# Patient Record
Sex: Male | Born: 1995 | Race: White | Hispanic: No | Marital: Single | Smoking: Never smoker
Health system: Southern US, Community
[De-identification: ages and names within clinical notes are randomized; demographics above are authoritative.]

## PROBLEM LIST (undated history)

## (undated) DIAGNOSIS — S42309A Unspecified fracture of shaft of humerus, unspecified arm, initial encounter for closed fracture: Secondary | ICD-10-CM

## (undated) HISTORY — DX: Unspecified fracture of shaft of humerus, unspecified arm, initial encounter for closed fracture: S42.309A

## (undated) HISTORY — PX: TONSILLECTOMY: SUR1361

---
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
## 2018-08-09 DIAGNOSIS — R112 Nausea with vomiting, unspecified: Secondary | ICD-10-CM | POA: Diagnosis not present

## 2019-04-22 ENCOUNTER — Ambulatory Visit (HOSPITAL_COMMUNITY)
Admission: EM | Admit: 2019-04-22 | Discharge: 2019-04-22 | Disposition: A | Discharge status: Home / Self Care | Payer: Self-pay | Attending: Family Medicine | Admitting: Family Medicine

## 2019-04-22 ENCOUNTER — Ambulatory Visit (INDEPENDENT_AMBULATORY_CARE_PROVIDER_SITE_OTHER): Payer: Self-pay

## 2019-04-22 ENCOUNTER — Other Ambulatory Visit: Payer: Self-pay

## 2019-04-22 ENCOUNTER — Encounter (HOSPITAL_COMMUNITY): Payer: Self-pay

## 2019-04-22 ENCOUNTER — Ambulatory Visit (HOSPITAL_COMMUNITY): Payer: Self-pay

## 2019-04-22 DIAGNOSIS — M79642 Pain in left hand: Secondary | ICD-10-CM

## 2019-04-22 DIAGNOSIS — Z23 Encounter for immunization: Secondary | ICD-10-CM

## 2019-04-22 DIAGNOSIS — S61211A Laceration without foreign body of left index finger without damage to nail, initial encounter: Secondary | ICD-10-CM

## 2019-04-22 MED ORDER — TETANUS-DIPHTH-ACELL PERTUSSIS 5-2.5-18.5 LF-MCG/0.5 IM SUSP
0.5000 mL | Freq: Once | INTRAMUSCULAR | Status: AC
  Administered 2019-04-22: 0.5 mL via INTRAMUSCULAR

## 2019-04-22 MED ORDER — LIDOCAINE HCL 2 % IJ SOLN
INTRAMUSCULAR | Status: AC
  Filled 2019-04-22: qty 20

## 2019-04-22 MED ORDER — CEPHALEXIN 500 MG PO CAPS: 500.0000 mg | ORAL_CAPSULE | Freq: Two times a day (BID) | ORAL | 0 refills | Status: DC

## 2019-04-22 MED ORDER — LIDOCAINE-EPINEPHRINE (PF) 2 %-1:200000 IJ SOLN
INTRAMUSCULAR | Status: AC
  Filled 2019-04-22: qty 20

## 2019-04-22 MED ORDER — TETANUS-DIPHTH-ACELL PERTUSSIS 5-2.5-18.5 LF-MCG/0.5 IM SUSP
INTRAMUSCULAR | Status: AC
  Filled 2019-04-22: qty 0.5

## 2019-04-22 NOTE — ED Provider Notes (Signed)
[facility]    CSN: [id number] Arrival date & time: 04/22/19  1652      History   Chief Complaint Chief Complaint  Patient presents with  . Hand Injury    HPI Samuel Walker is a 24 y.o. male.   He is presenting with pain in his left hand.  He was at work and smashed his hand against the dumpster.  He was throwing pallets into the dumpster.  He has a laceration and pain over the PIP joint of the second and third digit.  This happened earlier today.  Denies any numbness or tingling.  HPI  Past Medical History:  Diagnosis Date  . Broken arm    right    There are no problems to display for this patient.   Past Surgical History:  Procedure Laterality Date  . APPENDECTOMY  2010  . TONSILLECTOMY         Home Medications    Prior to Admission medications   Medication Sig Start Date End Date Taking? Authorizing Provider  cephALEXin (KEFLEX) 500 MG capsule Take 1 capsule (500 mg total) by mouth 2 (two) times daily. 04/22/19   Myra Rude, MD    Family History No family history on file.  Social History Social History   Tobacco Use  . Smoking status: Never Smoker  Substance Use Topics  . Alcohol use: No  . Drug use: No     Allergies   Patient has no known allergies.   Review of Systems Review of Systems See HPI  Physical Exam Triage Vital Signs ED Triage Vitals [04/22/19 1715]  Enc Vitals Group     BP 136/84     Pulse Rate 71     Resp 16     Temp 98.5 F (36.9 C)     Temp Source Oral     SpO2 97 %     Weight      Height      Head Circumference      Peak Flow      Pain Score 4     Pain Loc      Pain Edu?      Excl. in GC?    No data found.  Updated Vital Signs BP 136/84   Pulse 71   Temp 98.5 F (36.9 C) (Oral)   Resp 16   SpO2 97%   Visual Acuity Right Eye Distance:   Left Eye Distance:   Bilateral Distance:    Right Eye Near:   Left Eye Near:    Bilateral Near:     Physical Exam Gen: NAD, alert, cooperative  with exam, well-appearing Neuro: normal tone, normal sensation to touch Psych:  normal insight, alert and oriented MSK:  Left hand: Flap tear over the PIP joint that is roughly 1 cm in length. Abrasion occurring over the PIP joint of the third digit on the dorsal aspect. Normal flexion. Limited extension. Neurovascular intact   UC Treatments / Results  Labs (all labs ordered are listed, but only abnormal results are displayed) Labs Reviewed - No data to display  EKG   Radiology DG Hand Complete Left  Result Date: 04/22/2019 CLINICAL DATA:  Left hand pain after getting it caught between two pallets today. EXAM: LEFT HAND - COMPLETE 3+ VIEW COMPARISON:  None. FINDINGS: No acute fracture or dislocation. Joint spaces are preserved. Bone mineralization is normal. Soft tissue swelling of the index and long fingers with soft tissue injury dorsal to the second and  third PIP joints and third DIP joint. No radiopaque foreign body identified. IMPRESSION: 1. Soft tissue injury to the index and long fingers. No acute osseous abnormality. Electronically Signed   By: Titus Dubin M.D.   On: 04/22/2019 18:30    Procedures Procedures (including critical care time)  Laceration Procedure Note:   Procedure Name: Laceration Repair Indication: Reduce risk of infection Location: left dorsal index finger PIP joint  Pre-Procedure Diagnosis: Laceration Post-Procedure Diagnosis: Repaired Laceration Informed consent was obtained before procedure started. PROCEDURE: The appropriate timeout was taken. The area was prepped and draped in the usual sterile fashion. Local anesthesia was achieved using 8cc of  Lidocaine 2% without epinephrine and 2 cc of 2% lidocaine with epinephrine. This was performed on a digital block of the index finger.. The wound was copiously irrigated. 5 5-0 Nylon and 4 4-0 interrupted sutures were placed.  Estimated blood loss was less than 0.5 mL. A dressing was applied to the  area and anticipatory guidance, as well as standard post-procedure care, was explained. Return precautions are given. The patient tolerated the procedure well without complications. Follow-up visit set for suture removal and evaluation of the laceration.   Medications Ordered in UC Medications  Tdap (BOOSTRIX) injection 0.5 mL (0.5 mLs Intramuscular Given 04/22/19 1854)    Initial Impression / Assessment and Plan / UC Course  I have reviewed the triage vital signs and the nursing notes.  Pertinent labs & imaging results that were available during my care of the patient were reviewed by me and considered in my medical decision making (see chart for details).     [name] is a 24 year old male that had an injury at work where he sustained a laceration of the dorsal aspect of the PIP joint and a flap tear. His hands were fairly dirty on exam but copiously irrigated and cleaned. Imaging did not demonstrate a fracture. 9 sutures were placed in interrupted fashion. He was provided a tetanus vaccine as well as antibiotics. Counseled on wound management. Given indications for more immediate follow-up and times able to get sutures removed.  Final Clinical Impressions(s) / UC Diagnoses   Final diagnoses:  Laceration of left index finger without foreign body without damage to nail, initial encounter  Left hand pain     Discharge Instructions     Please follow up in 8 days to have the sutures removed  Please take the antibiotics.  Please keep the fingers clean.     ED Prescriptions    Medication Sig Dispense Auth. Provider   cephALEXin (KEFLEX) 500 MG capsule Take 1 capsule (500 mg total) by mouth 2 (two) times daily. 14 capsule Rosemarie Ax, MD     PDMP not reviewed this encounter.   Rosemarie Ax, MD 04/22/19 [phone]

## 2019-04-22 NOTE — Discharge Instructions (Addendum)
Please follow up in 8 days to have the sutures removed  Please take the antibiotics.  Please keep the fingers clean.

## 2019-04-22 NOTE — ED Triage Notes (Signed)
R hand injury, states hand got caught between a dumpster and wooden pallet, unknown tetanus

## 2019-05-01 ENCOUNTER — Ambulatory Visit (HOSPITAL_COMMUNITY)
Admission: EM | Admit: 2019-05-01 | Discharge: 2019-05-01 | Disposition: A | Discharge status: Home / Self Care | Payer: Self-pay

## 2019-05-01 ENCOUNTER — Other Ambulatory Visit: Payer: Self-pay

## 2019-05-01 DIAGNOSIS — Z4802 Encounter for removal of sutures: Secondary | ICD-10-CM

## 2019-05-01 NOTE — ED Triage Notes (Addendum)
Patient presents to have sutures removed. Area looks clean, dry, intact, and appears to be healing well. 9 sutures removed in total. Aftercare instructions given to patient. Patient verbalized understanding.

## 2021-04-24 DIAGNOSIS — R369 Urethral discharge, unspecified: Secondary | ICD-10-CM | POA: Diagnosis not present

## 2021-04-24 DIAGNOSIS — R300 Dysuria: Secondary | ICD-10-CM | POA: Diagnosis not present

## 2021-04-30 ENCOUNTER — Other Ambulatory Visit: Payer: Self-pay

## 2021-04-30 ENCOUNTER — Emergency Department (HOSPITAL_BASED_OUTPATIENT_CLINIC_OR_DEPARTMENT_OTHER)
Admission: EM | Admit: 2021-04-30 | Discharge: 2021-04-30 | Disposition: A | Discharge status: Home / Self Care | Payer: BC Managed Care – PPO | Attending: Emergency Medicine | Admitting: Emergency Medicine

## 2021-04-30 ENCOUNTER — Encounter (HOSPITAL_BASED_OUTPATIENT_CLINIC_OR_DEPARTMENT_OTHER): Payer: Self-pay | Admitting: Pediatrics

## 2021-04-30 DIAGNOSIS — R300 Dysuria: Secondary | ICD-10-CM | POA: Diagnosis not present

## 2021-04-30 LAB — URINALYSIS, ROUTINE W REFLEX MICROSCOPIC
Bilirubin Urine: NEGATIVE
Glucose, UA: NEGATIVE mg/dL
Hgb urine dipstick: NEGATIVE
Ketones, ur: NEGATIVE mg/dL
Leukocytes,Ua: NEGATIVE
Nitrite: NEGATIVE
Protein, ur: NEGATIVE mg/dL
Specific Gravity, Urine: <1.005 — ABNORMAL LOW (ref 1.005–1.030)
pH: 7 (ref 5.0–8.0)

## 2021-04-30 MED ORDER — DOXYCYCLINE HYCLATE 100 MG PO CAPS: 100.0000 mg | ORAL_CAPSULE | Freq: Two times a day (BID) | ORAL | 0 refills | Status: DC

## 2021-04-30 MED ORDER — CEFTRIAXONE SODIUM 500 MG IJ SOLR
500.0000 mg | Freq: Once | INTRAMUSCULAR | Status: AC
  Administered 2021-04-30: 500 mg via INTRAMUSCULAR
  Filled 2021-04-30: qty 500

## 2021-04-30 NOTE — ED Triage Notes (Signed)
C/o painful urination x 2-3 days;  ?

## 2021-04-30 NOTE — ED Provider Notes (Signed)
?[facility] ?Provider Note ? ? ?CSN: [id number] ?Arrival date & time: 04/30/21  1216 ? ?  ? ?History ? ?Chief Complaint  ?Patient presents with  ? Dysuria  ? ? ?Samuel Walker is a 26 y.o. male. ? ?Patient presents emergency department for evaluation of dysuria.  Patient states that his symptoms started about 6 weeks ago.  At onset he had clear discharge.  He was last sexually active just prior to onset of symptoms.  He was not seen or treated for this.  He took oregano supplement which seemed to help.  He states that over the past 5 days he has had recurrent dysuria that occurs approximately twice for every 10 times he urinates.  No discharge.  No sores or lesions to the groin reported.  Denies other complaints.  No history of UTI. ? ? ?  ? ?Home Medications ?Prior to Admission medications   ?Not on File  ?   ? ?Allergies    ?Patient has no known allergies.   ? ?Review of Systems   ?Review of Systems ? ?Physical Exam ?Updated Vital Signs ?BP 140/71 (BP Location: Right Arm)   Pulse 72   Temp 97.9 ?F (36.6 ?C)   Resp 18   Ht 6\' 1"  (1.854 m)   Wt 71.7 kg   SpO2 99%   BMI 20.85 kg/m?  ?Physical Exam ?Vitals and nursing note reviewed.  ?Constitutional:   ?   Appearance: He is well-developed.  ?HENT:  ?   Head: Normocephalic and atraumatic.  ?Eyes:  ?   Conjunctiva/sclera: Conjunctivae normal.  ?Pulmonary:  ?   Effort: No respiratory distress.  ?Abdominal:  ?   Comments: No tenderness over the abdomen or pelvis, including suprapubic area.  ?Musculoskeletal:  ?   Cervical back: Normal range of motion and neck supple.  ?Skin: ?   General: Skin is warm and dry.  ?Neurological:  ?   Mental Status: He is alert.  ? ? ?ED Results / Procedures / Treatments   ?Labs ?(all labs ordered are listed, but only abnormal results are displayed) ?Labs Reviewed  ?URINALYSIS, ROUTINE W REFLEX MICROSCOPIC - Abnormal; Notable for the following components:  ?    Result Value  ? Color, Urine COLORLESS (*)   ?  Specific Gravity, Urine <1.005 (*)   ? All other components within normal limits  ?GC/CHLAMYDIA PROBE AMP ([place]) NOT AT [facility]  ? ? ?EKG ?None ? ?Radiology ?No results found. ? ?Procedures ?Procedures  ? ? ?Medications Ordered in ED ?Medications  ?cefTRIAXone (ROCEPHIN) injection 500 mg (has no administration in time range)  ? ? ?ED Course/ Medical Decision Making/ A&P ?  ? ?Patient seen and examined. History obtained directly from patient. Work-up including labs, imaging, EKG ordered in triage, if performed, were reviewed.   ? ?Labs/EKG: Independently reviewed and interpreted.  This included: UA, negative.  GC and chlamydia pending. ? ?Imaging: None ordered ? ?Medications/Fluids: Ordered: IM Rocephin 500 mg. ? ?Most recent vital signs reviewed and are as follows: ?BP 140/71 (BP Location: Right Arm)   Pulse 72   Temp 97.9 ?F (36.6 ?C)   Resp 18   Ht 6\' 1"  (1.854 m)   Wt 71.7 kg   SpO2 99%   BMI 20.85 kg/m?  ? ?Initial impression: Dysuria, potentially urethritis ? ?Home treatment plan: Will test and treat for STD exposure. Patient counseled on safe sexual practices, encouraged them to avoid sexual contact for 7 days. Patient verbalizes understanding and agrees with plan.   ? ?  Return instructions discussed with patient: Worsening pain, swelling, discharge, fever ? ?Follow-up instructions discussed with patient: Follow-up with PCP in 1 week if symptoms do not improve ? ?                        ?Medical Decision Making ?Amount and/or Complexity of Data Reviewed ?Labs: ordered. ? ?Risk ?Prescription drug management. ? ? ?Patient with intermittent dysuria, concerning for mild urethritis.  UA is negative for red or white blood cells.  GC/chlamydia pending. ? ? ? ? ? ? ? ?Final Clinical Impression(s) / ED Diagnoses ?Final diagnoses:  ?Dysuria  ? ? ?Rx / DC Orders ?ED Discharge Orders   ? ? None  ? ?  ? ? ?  ?Renne Crigler, PA-C ?04/30/21 1258 ? ?  ?Milagros Loll, MD ?05/02/21 1456 ? ?

## 2021-04-30 NOTE — Discharge Instructions (Signed)
Please read and follow all provided instructions. ? ?Your diagnoses today include:  ?1. Dysuria   ? ? ?Tests performed today include: ?Test for gonorrhea and chlamydia.  ?Vital signs. See below for your results today.  ? ?Medications:  ?For treatment of gonorrhea and chlamydia infections: You were treated with a rocephin (shot) today and given a prescription for 1 week of doxycycline.  ? ?Home care instructions:  ?Read educational materials contained in this packet and follow any instructions provided.  ? ?You should tell your partners about your infection and avoid having sex for one week to allow time for the medicine to work. ? ?Sexually transmitted disease testing also available at: ? ?[facility] ?[address] [place], phone [phone] or [phone]   ?Monday - Friday, call for an appointment ? ?Return instructions:  ?Please return to the Emergency Department if you experience worsening symptoms.  ?Please return if you have any other emergent concerns. ? ?Additional Information: ? ?Your vital signs today were: ?BP 140/71 (BP Location: Right Arm)   Pulse 72   Temp 97.9 ?F (36.6 ?C)   Resp 18   Ht 6\' 1"  (1.854 m)   Wt 71.7 kg   SpO2 99%   BMI 20.85 kg/m?  ?If your blood pressure (BP) was elevated above 135/85 this visit, please have this repeated by your doctor within one month. ?-------------- ? ?

## 2021-04-30 NOTE — ED Notes (Signed)
Discharge instructions, follow up care, and prescriptions reviewed and explained, pt verbalized understanding.  

## 2021-05-03 LAB — GC/CHLAMYDIA PROBE AMP (~~LOC~~) NOT AT ARMC
Chlamydia: NEGATIVE
Comment: NEGATIVE
Comment: NORMAL
Neisseria Gonorrhea: NEGATIVE

## 2021-06-02 IMAGING — DX DG HAND COMPLETE 3+V*L*
3 series · 3 of 3 positions shown · non-contrast
Comparison: None.

CLINICAL DATA: Left hand pain after getting it caught between two
pallets today.

EXAM:
LEFT HAND - COMPLETE 3+ VIEW

[hand pa]
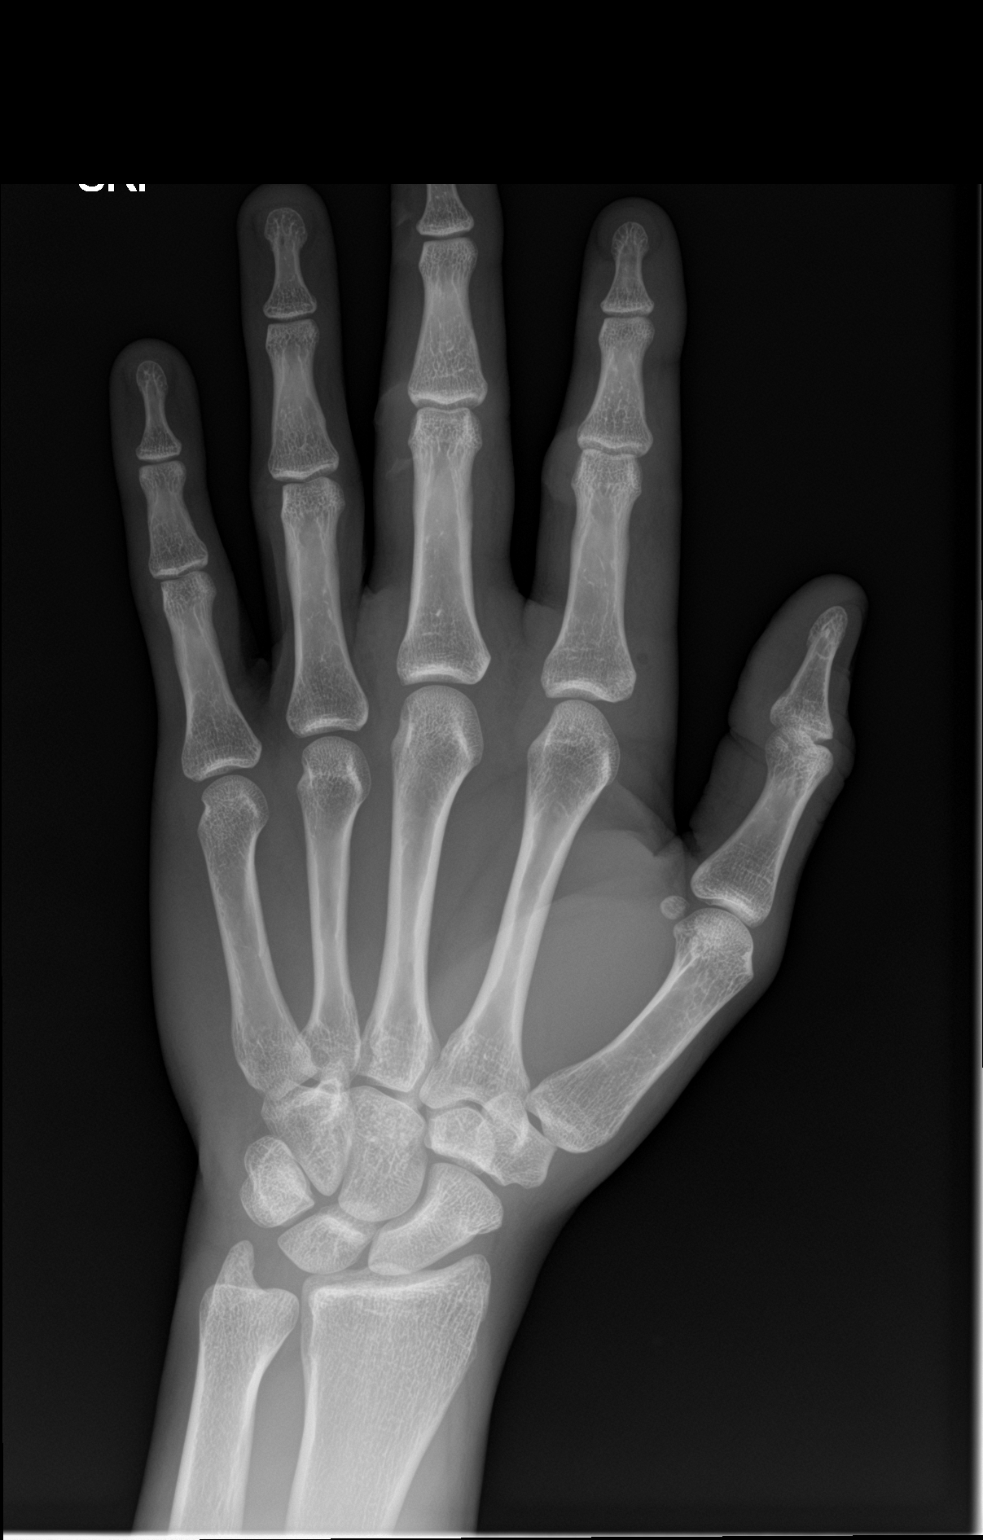

[hand obl]
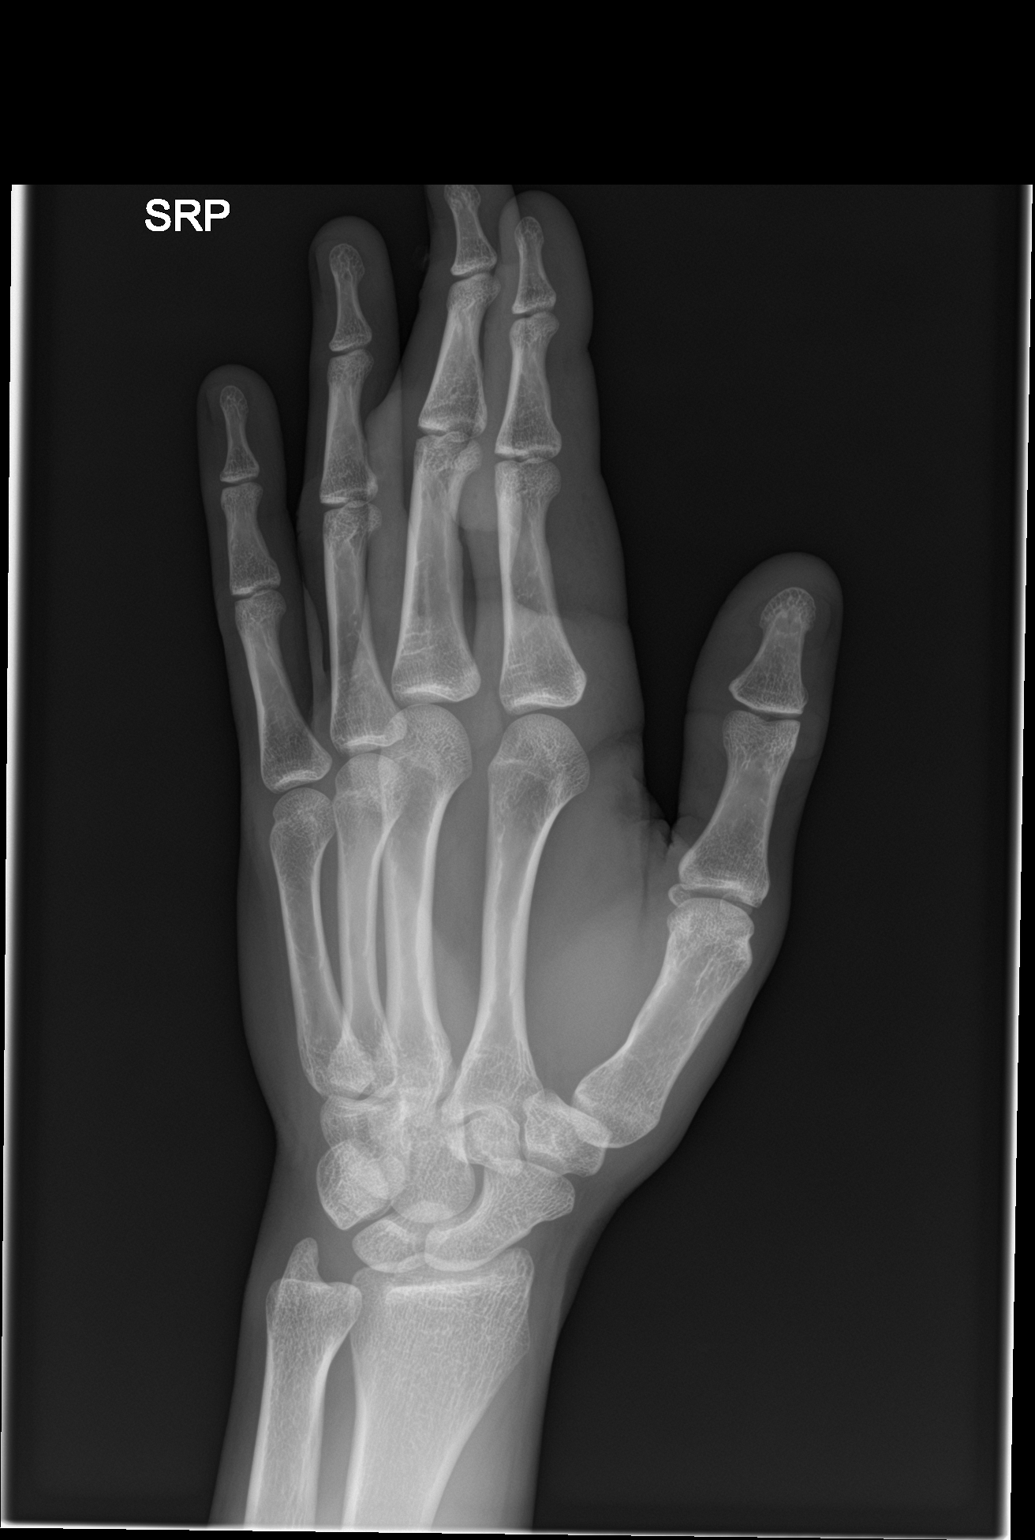

[hand lat]
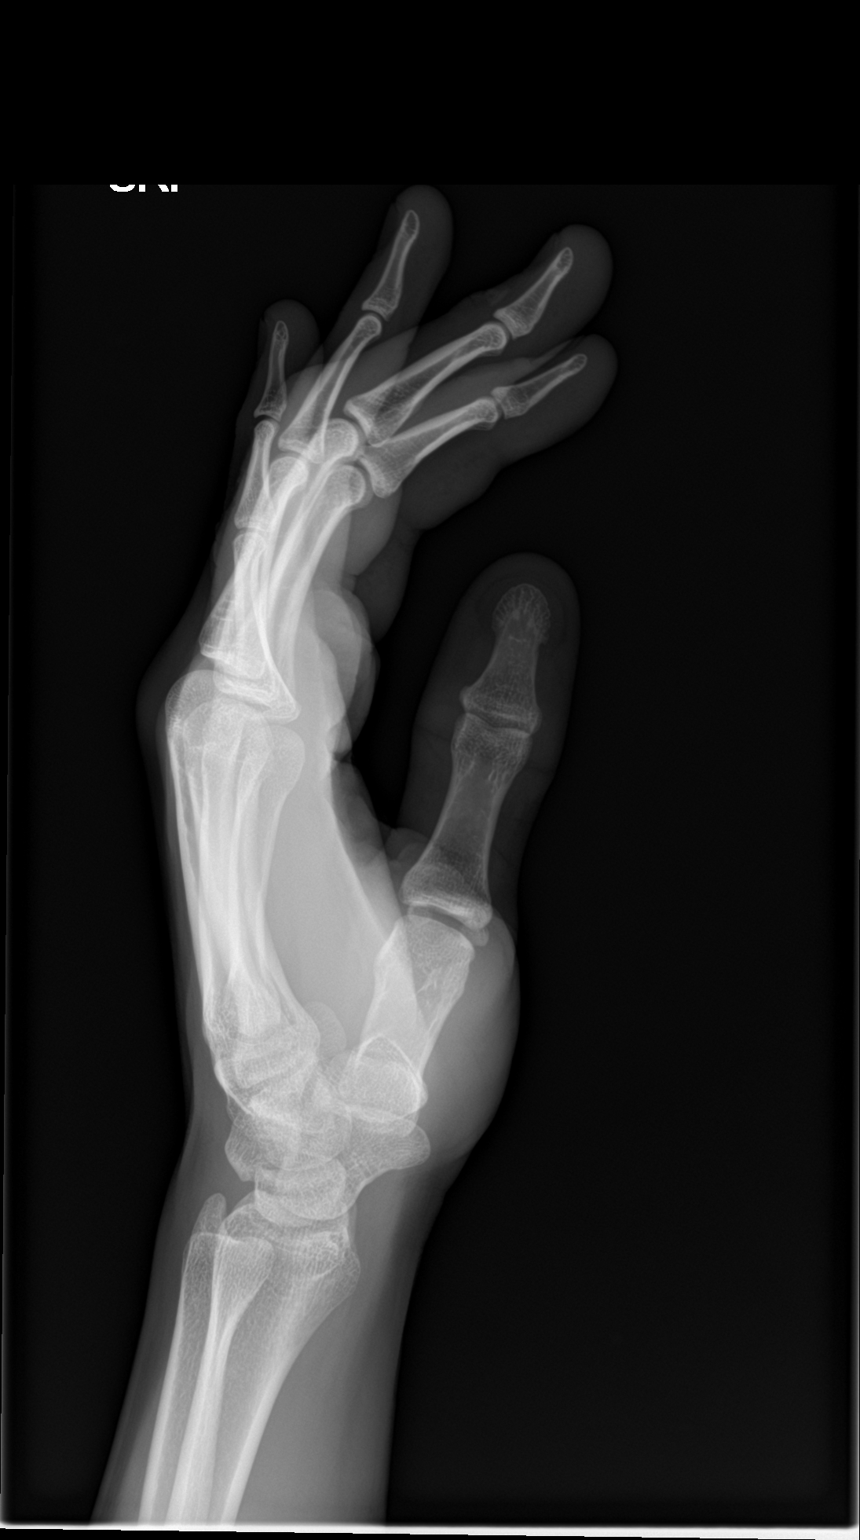

[3 of 3 positions shown; findings below may reference images not displayed]

FINDINGS: No acute fracture or dislocation. Joint spaces are preserved. Bone
mineralization is normal. Soft tissue swelling of the index and long
fingers with soft tissue injury dorsal to the second and third PIP
joints and third DIP joint. No radiopaque foreign body identified.
IMPRESSION: 1. Soft tissue injury to the index and long fingers. No acute
osseous abnormality.

## 2021-06-18 ENCOUNTER — Other Ambulatory Visit (HOSPITAL_COMMUNITY)
Admission: RE | Admit: 2021-06-18 | Discharge: 2021-06-18 | Disposition: A | Discharge status: Home / Self Care | Payer: BC Managed Care – PPO | Source: Ambulatory Visit | Attending: Adult Health | Admitting: Adult Health

## 2021-06-18 ENCOUNTER — Ambulatory Visit: Payer: BC Managed Care – PPO | Admitting: Adult Health

## 2021-06-18 ENCOUNTER — Encounter: Payer: Self-pay | Admitting: Adult Health

## 2021-06-18 VITALS — BP 100/60 | HR 70 | Temp 98.0°F | Ht 71.0 in | Wt 157.0 lb

## 2021-06-18 DIAGNOSIS — R300 Dysuria: Secondary | ICD-10-CM | POA: Insufficient documentation

## 2021-06-18 DIAGNOSIS — Z0000 Encounter for general adult medical examination without abnormal findings: Secondary | ICD-10-CM | POA: Diagnosis not present

## 2021-06-18 DIAGNOSIS — Z720 Tobacco use: Secondary | ICD-10-CM

## 2021-06-18 DIAGNOSIS — Z113 Encounter for screening for infections with a predominantly sexual mode of transmission: Secondary | ICD-10-CM | POA: Diagnosis not present

## 2021-06-18 LAB — COMPREHENSIVE METABOLIC PANEL
ALT: 31 U/L (ref 0–53)
AST: 29 U/L (ref 0–37)
Albumin: 4.6 g/dL (ref 3.5–5.2)
Alkaline Phosphatase: 74 U/L (ref 39–117)
BUN: 18 mg/dL (ref 6–23)
CO2: 28 mEq/L (ref 19–32)
Calcium: 9.9 mg/dL (ref 8.4–10.5)
Chloride: 101 mEq/L (ref 96–112)
Creatinine, Ser: 1.01 mg/dL (ref 0.40–1.50)
GFR: 104.74 mL/min (ref >60.00)
Glucose, Bld: 76 mg/dL (ref 70–99)
Potassium: 4.5 mEq/L (ref 3.5–5.1)
Sodium: 137 mEq/L (ref 135–145)
Total Bilirubin: 0.8 mg/dL (ref 0.2–1.2)
Total Protein: 7.5 g/dL (ref 6.0–8.3)

## 2021-06-18 LAB — CBC WITH DIFFERENTIAL/PLATELET
Basophils Absolute: 0 10*3/uL (ref 0.0–0.1)
Basophils Relative: 0.5 % (ref 0.0–3.0)
Eosinophils Absolute: 0.2 10*3/uL (ref 0.0–0.7)
Eosinophils Relative: 2.8 % (ref 0.0–5.0)
HCT: 48.8 % (ref 39.0–52.0)
Hemoglobin: 16.8 g/dL (ref 13.0–17.0)
Lymphocytes Relative: 29.7 % (ref 12.0–46.0)
Lymphs Abs: 1.9 10*3/uL (ref 0.7–4.0)
MCHC: 34.5 g/dL (ref 30.0–36.0)
MCV: 94.7 fl (ref 78.0–100.0)
Monocytes Absolute: 0.6 10*3/uL (ref 0.1–1.0)
Monocytes Relative: 8.5 % (ref 3.0–12.0)
Neutro Abs: 3.8 10*3/uL (ref 1.4–7.7)
Neutrophils Relative %: 58.5 % (ref 43.0–77.0)
Platelets: 236 10*3/uL (ref 150.0–400.0)
RBC: 5.15 Mil/uL (ref 4.22–5.81)
RDW: 12.2 % (ref 11.5–15.5)
WBC: 6.5 10*3/uL (ref 4.0–10.5)

## 2021-06-18 LAB — URINALYSIS, ROUTINE W REFLEX MICROSCOPIC
Bilirubin Urine: NEGATIVE
Hgb urine dipstick: NEGATIVE
Ketones, ur: NEGATIVE
Leukocytes,Ua: NEGATIVE
Nitrite: NEGATIVE
Specific Gravity, Urine: 1.02 (ref 1.000–1.030)
Total Protein, Urine: NEGATIVE
Urine Glucose: NEGATIVE
Urobilinogen, UA: 0.2 (ref 0.0–1.0)
pH: 5.5 (ref 5.0–8.0)

## 2021-06-18 LAB — TSH: TSH: 1.02 u[IU]/mL (ref 0.35–5.50)

## 2021-06-18 LAB — LIPID PANEL
Cholesterol: 126 mg/dL (ref 0–200)
HDL: 59.5 mg/dL (ref >39.00)
LDL Cholesterol: 61 mg/dL (ref 0–99)
NonHDL: 66.69
Total CHOL/HDL Ratio: 2
Triglycerides: 28 mg/dL (ref 0.0–149.0)
VLDL: 5.6 mg/dL (ref 0.0–40.0)

## 2021-06-18 NOTE — Progress Notes (Signed)
Patient presents to clinic today to establish care. He is a pleasant 26 year old male who  has a past medical history of Broken arm.   Acute Concerns: Establish Care/CPE   Dysuria -he was seen back in April 2023 at the emergency room for evaluation of dysuria.  At this time his symptoms started roughly 6 weeks prior.  At onset he had clear discharge.  He did report being sexually active just prior to onset of symptoms.  He took an oregano supplement which seemed to help but then over the next 5 days he had recurrent dysuria that occurred approximately twice for every 10 times he urinated.  No longer with having discharge at this time.  No sores or lesions on the groin.  His UA was negative.  He was treated with STD exposure with IM Rocephin 500 mg.  His urine gonorrhea/chlamydia test ended up being negative.  He reports today that after he received the antibiotics that his symptoms resolved until a few weeks ago when he started to experience dysuria again.  He has not had any discharge.  Denies fevers or chills.  Has not had any sexual encounters since he was last seen in the emergency room.  Has not noticed any sores, lesions, or rashes on his penis.  No known history of kidney stones.  He has not had any issues with painful bowel movements or abdominal pain.  Chronic Issues: None   Health Maintenance: Dental -- Routine  Vision -- Routine  Immunizations -- unknown  Diet: He tries to eat healthy  Exercise: Goes to the gym multiple times a week  Tobacco Use: Vape    Past Medical History:  Diagnosis Date   Broken arm    right    Past Surgical History:  Procedure Laterality Date   APPENDECTOMY  2010   TONSILLECTOMY      Current Outpatient Medications on File Prior to Visit  Medication Sig Dispense Refill   CREATINE PO Take 5 mg by mouth daily.     No current facility-administered medications on file prior to visit.    No Known Allergies  Family History  Problem  Relation Age of Onset   Miscarriages / Stillbirths Mother    Heart attack Father    Hyperlipidemia Father     Social History   Socioeconomic History   Marital status: Single    Spouse name: Not on file   Number of children: Not on file   Years of education: Not on file   Highest education level: Not on file  Occupational History   Not on file  Tobacco Use   Smoking status: Never   Smokeless tobacco: Not on file  Vaping Use   Vaping Use: Every day   Start date: 06/19/2019   Substances: Nicotine, Flavoring  Substance and Sexual Activity   Alcohol use: Yes    Comment: occasionally   Drug use: No   Sexual activity: Not on file  Other Topics Concern   Not on file  Social History Narrative   Patient is adopted   Social Determinants of Health   Financial Resource Strain: Not on file  Food Insecurity: Not on file  Transportation Needs: Not on file  Physical Activity: Not on file  Stress: Not on file  Social Connections: Not on file  Intimate Partner Violence: Not on file    Review of Systems  Constitutional: Negative.   HENT: Negative.    Eyes: Negative.   Respiratory: Negative.  Gastrointestinal: Negative.   Genitourinary:  Positive for dysuria. Negative for flank pain, frequency, hematuria and urgency.  Musculoskeletal: Negative.   Skin: Negative.   Neurological: Negative.   Endo/Heme/Allergies: Negative.   Psychiatric/Behavioral: Negative.      BP 100/60   Pulse 70   Temp 98 F (36.7 C) (Oral)   Ht 5\' 11"  (1.803 m)   Wt 157 lb (71.2 kg)   SpO2 98%   BMI 21.90 kg/m   Physical Exam Vitals and nursing note reviewed.  Constitutional:      Appearance: Normal appearance.  HENT:     Right Ear: Tympanic membrane, ear canal and external ear normal. There is no impacted cerumen.     Left Ear: Tympanic membrane, ear canal and external ear normal. There is no impacted cerumen.     Mouth/Throat:     Mouth: Mucous membranes are moist.     Pharynx: Oropharynx  is clear. No oropharyngeal exudate or posterior oropharyngeal erythema.  Eyes:     Extraocular Movements: Extraocular movements intact.     Pupils: Pupils are equal, round, and reactive to light.  Cardiovascular:     Rate and Rhythm: Normal rate and regular rhythm.     Pulses: Normal pulses.     Heart sounds: Normal heart sounds.  Pulmonary:     Effort: Pulmonary effort is normal.     Breath sounds: Normal breath sounds.  Abdominal:     General: Abdomen is flat. Bowel sounds are normal. There is no distension.     Palpations: Abdomen is soft. There is no mass.     Hernia: There is no hernia in the left inguinal area or right inguinal area.  Genitourinary:    Pubic Area: No rash.      Penis: Normal and circumcised. No erythema, tenderness, discharge, swelling or lesions.      Testes: Normal.     Epididymis:     Right: Normal.     Left: Normal.  Musculoskeletal:        General: Normal range of motion.  Lymphadenopathy:     Lower Body: No right inguinal adenopathy. No left inguinal adenopathy.  Skin:    General: Skin is warm and dry.     Capillary Refill: Capillary refill takes less than 2 seconds.  Neurological:     General: No focal deficit present.     Mental Status: He is alert and oriented to person, place, and time.  Psychiatric:        Mood and Affect: Mood normal.        Behavior: Behavior normal.        Thought Content: Thought content normal.        Judgment: Judgment normal.     Recent Results (from the past 2160 hour(s))  Urinalysis, Routine w reflex microscopic     Status: Abnormal   Collection Time: 04/30/21 12:24 PM  Result Value Ref Range   Color, Urine COLORLESS (A) YELLOW   APPearance CLEAR CLEAR   Specific Gravity, Urine <1.005 (L) 1.005 - 1.030   pH 7.0 5.0 - 8.0   Glucose, UA NEGATIVE NEGATIVE mg/dL   Hgb urine dipstick NEGATIVE NEGATIVE   Bilirubin Urine NEGATIVE NEGATIVE   Ketones, ur NEGATIVE NEGATIVE mg/dL   Protein, ur NEGATIVE NEGATIVE mg/dL    Nitrite NEGATIVE NEGATIVE   Leukocytes,Ua NEGATIVE NEGATIVE    Comment: Performed at 05/02/21, [address] [place], [place]  GC/Chlamydia probe amp [facility]) not at [facility]  Status: None   Collection Time: 04/30/21 12:52 PM  Result Value Ref Range   Neisseria Gonorrhea Negative    Chlamydia Negative    Comment Normal Reference [place] Chlamydia - Negative    Comment      Normal Reference Range Neisseria Gonorrhea - Negative    Assessment/Plan: 1. Routine general medical examination at a health care facility - Continue to exercise and eat healthy  - Follow up in one year or sooner if needed - CBC with Differential/Platelet; Future - Comprehensive metabolic panel; Future - Lipid panel; Future - TSH; Future - TSH - Lipid panel - Comprehensive metabolic panel - CBC with Differential/Platelet  2. Dysuria  - Urine cytology ancillary only; Future - Urinalysis with Reflex Microscopic; Future - Culture, Urine; Future - Culture, Urine - Urinalysis with Reflex Microscopic - Urine cytology ancillary only  3. Tobacco use - Encouraged to quit vaping    Shirline Frees, NP

## 2021-06-18 NOTE — Patient Instructions (Addendum)
It was great meeting you today   We will follow up with you regarding your lab work   I will see you back in one year or sooner if needed

## 2021-06-19 LAB — URINE CULTURE
MICRO NUMBER:: 13506548
Result:: NO GROWTH
SPECIMEN QUALITY:: ADEQUATE

## 2021-06-21 LAB — URINE CYTOLOGY ANCILLARY ONLY
Bacterial Vaginitis-Urine: NEGATIVE
Candida Urine: NEGATIVE
Chlamydia: NEGATIVE
Comment: NEGATIVE
Comment: NORMAL
Neisseria Gonorrhea: NEGATIVE

## 2021-06-22 ENCOUNTER — Other Ambulatory Visit: Payer: Self-pay

## 2021-06-22 DIAGNOSIS — R300 Dysuria: Secondary | ICD-10-CM

## 2021-09-15 ENCOUNTER — Ambulatory Visit: Payer: BC Managed Care – PPO | Admitting: Adult Health

## 2021-09-15 ENCOUNTER — Ambulatory Visit (INDEPENDENT_AMBULATORY_CARE_PROVIDER_SITE_OTHER): Payer: BC Managed Care – PPO

## 2021-09-15 ENCOUNTER — Other Ambulatory Visit (HOSPITAL_COMMUNITY)
Admission: RE | Admit: 2021-09-15 | Discharge: 2021-09-15 | Disposition: A | Discharge status: Home / Self Care | Payer: BC Managed Care – PPO | Source: Ambulatory Visit | Attending: Adult Health | Admitting: Adult Health

## 2021-09-15 VITALS — BP 136/78 | HR 65 | Temp 98.0°F | Wt 158.6 lb

## 2021-09-15 DIAGNOSIS — R300 Dysuria: Secondary | ICD-10-CM

## 2021-09-15 LAB — POCT URINALYSIS DIPSTICK
Bilirubin, UA: NEGATIVE
Blood, UA: NEGATIVE
Glucose, UA: NEGATIVE
Ketones, UA: NEGATIVE
Nitrite, UA: NEGATIVE
Protein, UA: POSITIVE — AB
Spec Grav, UA: 1.015 (ref 1.010–1.025)
Urobilinogen, UA: 0.2 E.U./dL
pH, UA: 6.5 (ref 5.0–8.0)

## 2021-09-15 NOTE — Patient Instructions (Signed)
It was great seeing you today and I am sorry you are still going through this.   I am going to check some urine tests and do an xray today   I have sent you to urology in [place], let me know if they do not call you in a week

## 2021-09-15 NOTE — Progress Notes (Signed)
Subjective:    Patient ID: Samuel Walker, male    DOB: Mar 21, 1995, 26 y.o.   MRN: [id number]  HPI  26 year old male who  has a past medical history of Broken arm.  He presents to the office today for recurrent dysuria, and urinary hesitancy.  He was seen back in April 2023 at the emergency room for the symptoms.  At onset he had clear discharge and reported being sexually active just prior to onset of symptoms.  At this time his UA was negative.  He was treated for STD exposure with an IM Rocephin 500 mg.  Gonorrhea/chlamydia test ended up being negative.  He was then seen the office on 06/18/2021 for similar symptoms.  His urine cytology was negative so was his UA with reflex and urine culture.  Today he reports that his symptoms came back on a week ago and it is "the worst that its ever been in regards to pain".  He reports that when he gets up to urinate nothing "comes out" and he has to force himself to urinate.  When he does urinate it is excessive amount of urine.  He does continue to have some clear discharge.  Has had some new sexual partners since the last time he was seen.  Denies hematuria, fevers or chills    Review of Systems See HPI   Past Medical History:  Diagnosis Date   Broken arm    right    Social History   Socioeconomic History   Marital status: Single    Spouse name: Not on file   Number of children: Not on file   Years of education: Not on file   Highest education level: Not on file  Occupational History   Not on file  Tobacco Use   Smoking status: Never   Smokeless tobacco: Not on file  Vaping Use   Vaping Use: Every day   Start date: 06/19/2019   Substances: Nicotine, Flavoring  Substance and Sexual Activity   Alcohol use: Yes    Comment: occasionally   Drug use: No   Sexual activity: Not on file  Other Topics Concern   Not on file  Social History Narrative   Patient is adopted   Social Determinants of Health   Financial Resource  Strain: Not on file  Food Insecurity: Not on file  Transportation Needs: Not on file  Physical Activity: Not on file  Stress: Not on file  Social Connections: Not on file  Intimate Partner Violence: Not on file    Past Surgical History:  Procedure Laterality Date   APPENDECTOMY  2010   TONSILLECTOMY      Family History  Problem Relation Age of Onset   Miscarriages / Stillbirths Mother    Heart attack Father    Hyperlipidemia Father     No Known Allergies  Current Outpatient Medications on File Prior to Visit  Medication Sig Dispense Refill   CREATINE PO Take 5 mg by mouth daily.     No current facility-administered medications on file prior to visit.    BP 136/78 (BP Location: Left Arm, Patient Position: Sitting, Cuff Size: Normal)   Pulse 65   Temp 98 F (36.7 C) (Oral)   Wt 158 lb 9.6 oz (71.9 kg)   SpO2 97%   BMI 22.12 kg/m       Objective:   Physical Exam Vitals and nursing note reviewed.  Constitutional:      Appearance: Normal appearance.  Abdominal:  General: Abdomen is flat. Bowel sounds are normal.     Palpations: Abdomen is soft.  Genitourinary:    Pubic Area: No rash.      Penis: Normal and circumcised.      Testes: Normal. Cremasteric reflex is present.  Musculoskeletal:        General: Normal range of motion.  Skin:    General: Skin is warm and dry.  Neurological:     General: No focal deficit present.     Mental Status: He is alert and oriented to person, place, and time.  Psychiatric:        Mood and Affect: Mood normal.        Behavior: Behavior normal.        Thought Content: Thought content normal.        Assessment & Plan:  1. Dysuria  - POC Urinalysis Dipstick + protein and leuks - Will check culture and Urine cytology.  - KUB to look for kidney stones and refer to urology for evaluation as there is concern for possible stricture  - Culture, Urine; Future - Urine cytology ancillary only; Future - DG Abd 1 View;  Future - Ambulatory referral to Urology  Shirline Frees, NP  Time spent with patient today was 30 minutes which consisted of chart review, discussing dysuria and decreased urination, work up, treatment answering questions and documentation.

## 2021-09-16 LAB — URINE CYTOLOGY ANCILLARY ONLY
Candida Urine: NEGATIVE
Chlamydia: NEGATIVE
Comment: NEGATIVE
Comment: NEGATIVE
Comment: NORMAL
Neisseria Gonorrhea: NEGATIVE
Trichomonas: NEGATIVE

## 2021-09-16 LAB — URINE CULTURE
MICRO NUMBER:: 13879049
Result:: NO GROWTH
SPECIMEN QUALITY:: ADEQUATE

## 2021-09-17 ENCOUNTER — Ambulatory Visit: Payer: BC Managed Care – PPO | Admitting: Physician Assistant

## 2021-09-17 VITALS — BP 126/71 | HR 51 | Resp 20

## 2021-09-17 DIAGNOSIS — R3913 Splitting of urinary stream: Secondary | ICD-10-CM | POA: Diagnosis not present

## 2021-09-17 DIAGNOSIS — R300 Dysuria: Secondary | ICD-10-CM | POA: Diagnosis not present

## 2021-09-17 LAB — URINALYSIS, ROUTINE W REFLEX MICROSCOPIC
Bilirubin, UA: NEGATIVE
Glucose, UA: NEGATIVE
Ketones, UA: NEGATIVE
Leukocytes,UA: NEGATIVE
Nitrite, UA: NEGATIVE
Protein,UA: NEGATIVE
RBC, UA: NEGATIVE
Specific Gravity, UA: 1.015 (ref 1.005–1.030)
Urobilinogen, Ur: 0.2 mg/dL (ref 0.2–1.0)
pH, UA: 5.5 (ref 5.0–7.5)

## 2021-09-17 MED ORDER — SULFAMETHOXAZOLE-TRIMETHOPRIM 800-160 MG PO TABS: 1.0000 | ORAL_TABLET | Freq: Two times a day (BID) | ORAL | 0 refills | Status: AC

## 2021-09-17 MED ORDER — DOXYCYCLINE HYCLATE 100 MG PO CAPS: 100.0000 mg | ORAL_CAPSULE | Freq: Two times a day (BID) | ORAL | 0 refills | Status: DC

## 2021-09-17 NOTE — Progress Notes (Unsigned)
09/17/2021 9:44 AM   Samuel Walker [birth year] [id number]   Assessment:  1. Dysuria - Urinalysis, Routine w reflex microscopic  2. Split urinary stream    Plan: Pt had severe NV with Doxy in April. Will start Bactrim DS x 1 month and FU in 6 weeks for recheck and possible cysto. Pt given Bactrim precautions and printed information. Advised to use sun screen and cover skin when outdoors  Chief Complaint: No chief complaint on file.   Referring provider: Shirline Frees, NP [address] [place],  [place] [address]   History of Present Illness:  Samuel Walker is a 26 y.o. year old male who is seen in consultation from Samuel Frees, NP  for evaluation of persistent dysuria onset March 2023. Pt states sxs are intermittent and last up to 1 week at a time. Pain is always at the glans and described as burning through the entire stream. Most recent occurrence worse than past. Pt occasionally has clear DC that "clogs" the urethral meatus causing him to strain and "split" the meatus manually to void. Occasional split stream, weakness of stream, and nocturia up to 3 times per night..  Symptoms improved somewhat while on antibiotics, but returned soon after.  He has never been on treatment for more than a few days.  Patient denies gross hematuria.  No increase in frequency.  No scrotal pain or swelling.  Patient denies pain with intercourse.  No pain with ejaculation.  No history of penile or scrotal trauma.  Denies history of stone disease.  He has had 1 sexual partner in the past 5 years and has no history of STI in the past. Only h/o catheter was at [facility] during emergency appey. No trauma of which he is aware  UA= clear  04/30/21 STI screen negative 06/18/21 Cx No growth, cytology negative for GC, clamydia, candida, clue cells 09/15/21 Cx No growth, UA +protein, repeat cytology neg STIs  04/30/21 ED Note Patient presents emergency department for evaluation of dysuria.   Patient states that his symptoms started about 6 weeks ago.  At onset he had clear discharge.  He was last sexually active just prior to onset of symptoms.  He was not seen or treated for this.  He took oregano supplement which seemed to help.  He states that over the past 5 days he has had recurrent dysuria that occurs approximately twice for every 10 times he urinates.  No discharge.  No sores or lesions to the groin reported.  Denies other complaints.  No history of UTI.   Portions of the above documentation were copied from a prior visit for review purposes only. Medical records including notes, lab results, and imaging studies reviewed during pt OV.  Past Medical History:  Past Medical History:  Diagnosis Date   Broken arm    right    Past Surgical History:  Past Surgical History:  Procedure Laterality Date   APPENDECTOMY  2010   TONSILLECTOMY      Allergies:  No Known Allergies  Family History:  Family History  Problem Relation Age of Onset   Miscarriages / Stillbirths Mother    Heart attack Father    Hyperlipidemia Father     Social History:  Social History   Tobacco Use   Smoking status: Never  Vaping Use   Vaping Use: Every day   Start date: 06/19/2019   Substances: Nicotine, Flavoring  Substance Use Topics   Alcohol use: Yes    Comment: occasionally  Drug use: No    Review of symptoms:  Constitutional:  Negative for unexplained weight loss, night sweats, fever, chills ENT:  Negative for nose bleeds, sinus pain, painful swallowing CV:  Negative for chest pain, shortness of breath, exercise intolerance, palpitations, loss of consciousness Resp:  Negative for cough, wheezing, shortness of breath GI:  Negative for nausea, vomiting, diarrhea, bloody stools GU:  Positives noted in HPI; otherwise negative for gross hematuria, urinary incontinence Neuro:  Negative for seizures, poor balance, limb weakness, slurred speech Psych:  Negative for lack of energy,  depression, anxiety Endocrine:  Negative for polydipsia, polyuria, symptoms of hypoglycemia (dizziness, hunger, sweating) Hematologic:  Negative for anemia, purpura, petechia, prolonged or excessive bleeding, use of anticoagulants   Physical Exam: BP 126/71   Pulse (!) 51   Resp 20   Constitutional:  Alert and oriented, No acute distress. HEENT: NCAT, moist mucus membranes.  Trachea midline, no masses. Cardiovascular: Regular rate and rhythm without murmur, rub, or gallops No clubbing, cyanosis, or edema. Respiratory: Normal respiratory effort, clear to auscultation bilaterally GI: Abdomen is soft, nontender, nondistended, no abdominal masses. No hernias appreciated. GU: Normal circumcised phallus with scant DC noted in meatus. Easily patent with minimal manipulation. Scrotum/testes without tenderness or masses. BACK:  Non-tender to palpation.  No CVAT Lymph: [place]dy inguinal lymphadenopathy without tenderness Skin: No obvious rashes, warm, dry, intact Neurologic: Alert and oriented, Cranial nerves grossly intact, no focal deficits, moving all 4 extremities. Psychiatric: Appropriate. Normal mood and affect.  Laboratory Data: No results found for this or any previous visit (from the past 24 hour(s)).  Lab Results  Component Value Date   WBC 6.5 06/18/2021   HGB 16.8 06/18/2021   HCT 48.8 06/18/2021   MCV 94.7 06/18/2021   PLT 236.0 06/18/2021    Lab Results  Component Value Date   CREATININE 1.01 06/18/2021     Urinalysis    Component Value Date/Time   COLORURINE YELLOW 06/18/2021 1330   APPEARANCEUR CLEAR 06/18/2021 1330   LABSPEC 1.020 06/18/2021 1330   PHURINE 5.5 06/18/2021 1330   GLUCOSEU NEGATIVE 06/18/2021 1330   HGBUR NEGATIVE 06/18/2021 1330   BILIRUBINUR negative 09/15/2021 1040   KETONESUR NEGATIVE 06/18/2021 1330   PROTEINUR Positive (A) 09/15/2021 1040   PROTEINUR NEGATIVE 04/30/2021 1224   UROBILINOGEN 0.2 09/15/2021 1040   UROBILINOGEN 0.2 06/18/2021  1330   NITRITE negative 09/15/2021 1040   NITRITE NEGATIVE 06/18/2021 1330   LEUKOCYTESUR Trace (A) 09/15/2021 1040   LEUKOCYTESUR NEGATIVE 06/18/2021 1330    No results found for: "LABMICR", "WBCUA", "RBCUA", "LABEPIT", "MUCUS", "BACTERIA"  Pertinent Imaging: Results for orders placed in visit on 09/15/21  DG Abd 1 View  Narrative CLINICAL DATA:  Dysuria  EXAM: ABDOMEN - 1 VIEW  COMPARISON:  None Available.  FINDINGS: The bowel gas pattern is normal. No radio-opaque calculi or other significant radiographic abnormality are seen.  IMPRESSION: Negative.   Electronically Signed By: Jasmine Pang M.D. On: 09/15/2021 23:50  No results found for this or any previous visit.     [place], Regan Rakers, PA-C [facility] [place]

## 2021-10-05 DIAGNOSIS — L700 Acne vulgaris: Secondary | ICD-10-CM | POA: Diagnosis not present

## 2021-10-28 DIAGNOSIS — T1512XA Foreign body in conjunctival sac, left eye, initial encounter: Secondary | ICD-10-CM | POA: Diagnosis not present

## 2021-10-29 ENCOUNTER — Encounter: Payer: Self-pay | Admitting: Urology

## 2021-10-29 ENCOUNTER — Ambulatory Visit: Payer: BC Managed Care – PPO | Admitting: Urology

## 2021-10-29 VITALS — BP 132/73 | HR 80

## 2021-10-29 DIAGNOSIS — R300 Dysuria: Secondary | ICD-10-CM

## 2021-10-29 DIAGNOSIS — R3913 Splitting of urinary stream: Secondary | ICD-10-CM | POA: Diagnosis not present

## 2021-10-29 LAB — URINALYSIS, ROUTINE W REFLEX MICROSCOPIC
Bilirubin, UA: NEGATIVE
Glucose, UA: NEGATIVE
Ketones, UA: NEGATIVE
Leukocytes,UA: NEGATIVE
Nitrite, UA: NEGATIVE
RBC, UA: NEGATIVE
Specific Gravity, UA: 1.015 (ref 1.005–1.030)
Urobilinogen, Ur: 0.2 mg/dL (ref 0.2–1.0)
pH, UA: 6 (ref 5.0–7.5)

## 2021-10-29 LAB — MICROSCOPIC EXAMINATION
Bacteria, UA: NONE SEEN
RBC, Urine: NONE SEEN /hpf (ref 0–2)
WBC, UA: NONE SEEN /hpf (ref 0–5)

## 2021-10-29 MED ORDER — CIPROFLOXACIN HCL 500 MG PO TABS: 500.0000 mg | ORAL_TABLET | Freq: Once | ORAL | Status: DC

## 2021-10-29 NOTE — Progress Notes (Unsigned)
10/29/2021 9:58 AM   Samuel Walker September 23, 1995 [id number]  Referring provider: Dorothyann Peng, NP [address] [place],  [place] [address]  Followup dysuria   HPI: [name] is a 26yo here for followup for prostatitis and dysuria. He finished course of bactrim and LUTS have resolved. No dysuria. Urine stream strong. Nocturia 0-1x. No other complaints today   PMH: Past Medical History:  Diagnosis Date   Broken arm    right    Surgical History: Past Surgical History:  Procedure Laterality Date   APPENDECTOMY  2010   TONSILLECTOMY      Home Medications:  Allergies as of 10/29/2021       Reactions   Doxycycline Nausea And Vomiting        Medication List        Accurate as of October 29, 2021  9:58 AM. If you have any questions, ask your nurse or doctor.          CREATINE PO Take 5 mg by mouth daily.   sulfamethoxazole-trimethoprim 800-160 MG tablet Commonly known as: BACTRIM DS Take 1 tablet by mouth 2 (two) times daily.        Allergies:  Allergies  Allergen Reactions   Doxycycline Nausea And Vomiting    Family History: Family History  Problem Relation Age of Onset   Miscarriages / Stillbirths Mother    Heart attack Father    Hyperlipidemia Father     Social History:  reports that he has never smoked. He does not have any smokeless tobacco history on file. He reports current alcohol use. He reports that he does not use drugs.  ROS: All other review of systems were reviewed and are negative except what is noted above in HPI  Physical Exam: BP 132/73   Pulse 80   Constitutional:  Alert and oriented, No acute distress. HEENT: [place] AT, moist mucus membranes.  Trachea midline, no masses. Cardiovascular: No clubbing, cyanosis, or edema. Respiratory: Normal respiratory effort, no increased work of breathing. GI: Abdomen is soft, nontender, nondistended, no abdominal masses GU: No CVA tenderness.  Lymph: No cervical or  inguinal lymphadenopathy. Skin: No rashes, bruises or suspicious lesions. Neurologic: Grossly intact, no focal deficits, moving all 4 extremities. Psychiatric: Normal mood and affect.  Laboratory Data: Lab Results  Component Value Date   WBC 6.5 06/18/2021   HGB 16.8 06/18/2021   HCT 48.8 06/18/2021   MCV 94.7 06/18/2021   PLT 236.0 06/18/2021    Lab Results  Component Value Date   CREATININE 1.01 06/18/2021    No results found for: "PSA"  No results found for: "TESTOSTERONE"  No results found for: "HGBA1C"  Urinalysis    Component Value Date/Time   COLORURINE YELLOW 06/18/2021 1330   APPEARANCEUR Clear 09/17/2021 0930   LABSPEC 1.020 06/18/2021 1330   PHURINE 5.5 06/18/2021 1330   GLUCOSEU Negative 09/17/2021 0930   GLUCOSEU NEGATIVE 06/18/2021 1330   HGBUR NEGATIVE 06/18/2021 1330   BILIRUBINUR Negative 09/17/2021 0930   KETONESUR NEGATIVE 06/18/2021 1330   PROTEINUR Negative 09/17/2021 0930   PROTEINUR NEGATIVE 04/30/2021 1224   UROBILINOGEN 0.2 09/15/2021 1040   UROBILINOGEN 0.2 06/18/2021 1330   NITRITE Negative 09/17/2021 0930   NITRITE NEGATIVE 06/18/2021 1330   LEUKOCYTESUR Negative 09/17/2021 0930   LEUKOCYTESUR NEGATIVE 06/18/2021 1330    Lab Results  Component Value Date   LABMICR Comment 09/17/2021    Pertinent Imaging:  Results for orders placed in visit on 09/15/21  DG Abd 1 View  Narrative CLINICAL DATA:  Dysuria  EXAM: ABDOMEN - 1 VIEW  COMPARISON:  None Available.  FINDINGS: The bowel gas pattern is normal. No radio-opaque calculi or other significant radiographic abnormality are seen.  IMPRESSION: Negative.   Electronically Signed By: Jasmine Pang M.D. On: 09/15/2021 23:50  No results found for this or any previous visit.  No results found for this or any previous visit.  No results found for this or any previous visit.  No results found for this or any previous visit.  No valid procedures specified. No results  found for this or any previous visit.  No results found for this or any previous visit.   Assessment & Plan:    1. Dysuria -resolved - Urinalysis, Routine w reflex microscopic - ciprofloxacin (CIPRO) tablet 500 mg  2. Split urinary stream resolved   No follow-ups on file.  Wilkie Aye, MD  [facility] [place]

## 2021-10-29 NOTE — Patient Instructions (Signed)

## 2021-11-08 DIAGNOSIS — L700 Acne vulgaris: Secondary | ICD-10-CM | POA: Diagnosis not present

## 2021-11-08 DIAGNOSIS — K130 Diseases of lips: Secondary | ICD-10-CM | POA: Diagnosis not present

## 2021-12-09 DIAGNOSIS — L700 Acne vulgaris: Secondary | ICD-10-CM | POA: Diagnosis not present

## 2021-12-09 DIAGNOSIS — K130 Diseases of lips: Secondary | ICD-10-CM | POA: Diagnosis not present

## 2021-12-10 DIAGNOSIS — L700 Acne vulgaris: Secondary | ICD-10-CM | POA: Diagnosis not present

## 2022-01-11 DIAGNOSIS — L700 Acne vulgaris: Secondary | ICD-10-CM | POA: Diagnosis not present

## 2022-01-11 DIAGNOSIS — K130 Diseases of lips: Secondary | ICD-10-CM | POA: Diagnosis not present

## 2022-02-10 DIAGNOSIS — L853 Xerosis cutis: Secondary | ICD-10-CM | POA: Diagnosis not present

## 2022-02-10 DIAGNOSIS — L700 Acne vulgaris: Secondary | ICD-10-CM | POA: Diagnosis not present

## 2022-02-10 DIAGNOSIS — R519 Headache, unspecified: Secondary | ICD-10-CM | POA: Diagnosis not present

## 2022-02-10 DIAGNOSIS — K130 Diseases of lips: Secondary | ICD-10-CM | POA: Diagnosis not present

## 2022-04-25 DIAGNOSIS — K130 Diseases of lips: Secondary | ICD-10-CM | POA: Diagnosis not present

## 2022-04-25 DIAGNOSIS — L700 Acne vulgaris: Secondary | ICD-10-CM | POA: Diagnosis not present

## 2022-04-25 DIAGNOSIS — R519 Headache, unspecified: Secondary | ICD-10-CM | POA: Diagnosis not present

## 2022-04-25 DIAGNOSIS — L853 Xerosis cutis: Secondary | ICD-10-CM | POA: Diagnosis not present

## 2022-05-06 ENCOUNTER — Ambulatory Visit: Payer: BC Managed Care – PPO | Admitting: Urology

## 2023-01-31 ENCOUNTER — Telehealth: Payer: Self-pay | Admitting: Adult Health

## 2023-01-31 DIAGNOSIS — L570 Actinic keratosis: Secondary | ICD-10-CM | POA: Diagnosis not present

## 2023-01-31 NOTE — Telephone Encounter (Signed)
Lmom for pt to callback to sch cpe or ov

## 2023-07-31 DIAGNOSIS — I788 Other diseases of capillaries: Secondary | ICD-10-CM | POA: Diagnosis not present
